# Patient Record
Sex: Male | Born: 1965 | Hispanic: Yes | Marital: Married | Smoking: Light tobacco smoker
Health system: Southern US, Community
[De-identification: ages and names within clinical notes are randomized; demographics above are authoritative.]

---

## 2019-02-15 ENCOUNTER — Emergency Department (HOSPITAL_COMMUNITY): Payer: Self-pay

## 2019-02-15 ENCOUNTER — Other Ambulatory Visit: Payer: Self-pay

## 2019-02-15 ENCOUNTER — Emergency Department (HOSPITAL_COMMUNITY): Payer: Self-pay | Admitting: Anesthesiology

## 2019-02-15 ENCOUNTER — Encounter (HOSPITAL_COMMUNITY)
Admission: EM | Disposition: A | Discharge status: Home / Self Care | Payer: Self-pay | Source: Home / Self Care | Attending: Emergency Medicine

## 2019-02-15 ENCOUNTER — Encounter (HOSPITAL_COMMUNITY): Payer: Self-pay | Admitting: Emergency Medicine

## 2019-02-15 ENCOUNTER — Ambulatory Visit (HOSPITAL_COMMUNITY)
Admission: EM | Admit: 2019-02-15 | Discharge: 2019-02-15 | Disposition: A | Discharge status: Home / Self Care | Payer: Self-pay | Attending: Emergency Medicine | Admitting: Emergency Medicine

## 2019-02-15 DIAGNOSIS — S6991XA Unspecified injury of right wrist, hand and finger(s), initial encounter: Secondary | ICD-10-CM

## 2019-02-15 DIAGNOSIS — Y990 Civilian activity done for income or pay: Secondary | ICD-10-CM | POA: Insufficient documentation

## 2019-02-15 DIAGNOSIS — W268XXA Contact with other sharp object(s), not elsewhere classified, initial encounter: Secondary | ICD-10-CM | POA: Insufficient documentation

## 2019-02-15 DIAGNOSIS — F17200 Nicotine dependence, unspecified, uncomplicated: Secondary | ICD-10-CM | POA: Insufficient documentation

## 2019-02-15 DIAGNOSIS — S68521A Partial traumatic transphalangeal amputation of right thumb, initial encounter: Secondary | ICD-10-CM | POA: Insufficient documentation

## 2019-02-15 DIAGNOSIS — Z20828 Contact with and (suspected) exposure to other viral communicable diseases: Secondary | ICD-10-CM | POA: Insufficient documentation

## 2019-02-15 DIAGNOSIS — Z419 Encounter for procedure for purposes other than remedying health state, unspecified: Secondary | ICD-10-CM

## 2019-02-15 HISTORY — PX: I & D EXTREMITY: SHX5045

## 2019-02-15 LAB — SARS CORONAVIRUS 2 BY RT PCR (HOSPITAL ORDER, PERFORMED IN ~~LOC~~ HOSPITAL LAB): SARS Coronavirus 2: NEGATIVE

## 2019-02-15 SURGERY — IRRIGATION AND DEBRIDEMENT EXTREMITY
Anesthesia: General | Site: Finger | Laterality: Right

## 2019-02-15 MED ORDER — PROMETHAZINE HCL 25 MG/ML IJ SOLN: 6.2500 mg | INTRAMUSCULAR | Status: DC | PRN

## 2019-02-15 MED ORDER — LACTATED RINGERS IV SOLN: INTRAVENOUS | Status: DC

## 2019-02-15 MED ORDER — TETANUS-DIPHTH-ACELL PERTUSSIS 5-2.5-18.5 LF-MCG/0.5 IM SUSP: 0.5000 mL | Freq: Once | INTRAMUSCULAR | Status: DC

## 2019-02-15 MED ORDER — FENTANYL CITRATE (PF) 100 MCG/2ML IJ SOLN
INTRAMUSCULAR | Status: DC | PRN
  Administered 2019-02-15 (×2): 100 ug via INTRAVENOUS

## 2019-02-15 MED ORDER — EPHEDRINE SULFATE 50 MG/ML IJ SOLN
INTRAMUSCULAR | Status: DC | PRN
  Administered 2019-02-15: 10 mg via INTRAVENOUS

## 2019-02-15 MED ORDER — MIDAZOLAM HCL 5 MG/5ML IJ SOLN
INTRAMUSCULAR | Status: DC | PRN
  Administered 2019-02-15: 2 mg via INTRAVENOUS

## 2019-02-15 MED ORDER — 0.9 % SODIUM CHLORIDE (POUR BTL) OPTIME
TOPICAL | Status: DC | PRN
  Administered 2019-02-15: 19:00:00 1000 mL

## 2019-02-15 MED ORDER — HYDROMORPHONE HCL 1 MG/ML IJ SOLN: 0.2500 mg | INTRAMUSCULAR | Status: DC | PRN

## 2019-02-15 MED ORDER — MIDAZOLAM HCL 2 MG/2ML IJ SOLN
INTRAMUSCULAR | Status: AC
  Filled 2019-02-15: qty 2

## 2019-02-15 MED ORDER — LIDOCAINE 2% (20 MG/ML) 5 ML SYRINGE
INTRAMUSCULAR | Status: DC | PRN
  Administered 2019-02-15: 40 mg via INTRAVENOUS

## 2019-02-15 MED ORDER — ONDANSETRON HCL 4 MG/2ML IJ SOLN
INTRAMUSCULAR | Status: DC | PRN
  Administered 2019-02-15: 4 mg via INTRAVENOUS

## 2019-02-15 MED ORDER — OXYCODONE HCL 5 MG PO TABS: 5.0000 mg | ORAL_TABLET | Freq: Four times a day (QID) | ORAL | 0 refills | Status: AC | PRN

## 2019-02-15 MED ORDER — ACETAMINOPHEN 10 MG/ML IV SOLN: 1000.0000 mg | Freq: Once | INTRAVENOUS | Status: DC | PRN

## 2019-02-15 MED ORDER — BUPIVACAINE-EPINEPHRINE (PF) 0.5% -1:200000 IJ SOLN
INTRAMUSCULAR | Status: DC | PRN
  Administered 2019-02-15: 7 mL

## 2019-02-15 MED ORDER — LIDOCAINE HCL (PF) 1 % IJ SOLN
INTRAMUSCULAR | Status: AC
  Filled 2019-02-15: qty 30

## 2019-02-15 MED ORDER — ACETAMINOPHEN 325 MG PO TABS: 650.0000 mg | ORAL_TABLET | Freq: Four times a day (QID) | ORAL | Status: AC

## 2019-02-15 MED ORDER — BUPIVACAINE-EPINEPHRINE (PF) 0.5% -1:200000 IJ SOLN
INTRAMUSCULAR | Status: AC
  Filled 2019-02-15: qty 30

## 2019-02-15 MED ORDER — CEPHALEXIN 500 MG PO CAPS: 500.0000 mg | ORAL_CAPSULE | Freq: Four times a day (QID) | ORAL | 0 refills | Status: AC

## 2019-02-15 MED ORDER — LACTATED RINGERS IV SOLN
INTRAVENOUS | Status: DC
  Administered 2019-02-15: 18:00:00 via INTRAVENOUS

## 2019-02-15 MED ORDER — POVIDONE-IODINE 10 % EX SWAB: 2.0000 "application " | Freq: Once | CUTANEOUS | Status: DC

## 2019-02-15 MED ORDER — CHLORHEXIDINE GLUCONATE 4 % EX LIQD
60.0000 mL | Freq: Once | CUTANEOUS | Status: DC
  Filled 2019-02-15: qty 60

## 2019-02-15 MED ORDER — HYDROCODONE-ACETAMINOPHEN 5-325 MG PO TABS
1.0000 | ORAL_TABLET | Freq: Once | ORAL | Status: AC
  Administered 2019-02-15: 17:00:00 1 via ORAL
  Filled 2019-02-15: qty 1

## 2019-02-15 MED ORDER — ACETAMINOPHEN 160 MG/5ML PO SOLN: 325.0000 mg | Freq: Once | ORAL | Status: DC | PRN

## 2019-02-15 MED ORDER — LACTATED RINGERS IV SOLN
INTRAVENOUS | Status: DC | PRN
  Administered 2019-02-15: 18:00:00 via INTRAVENOUS

## 2019-02-15 MED ORDER — FENTANYL CITRATE (PF) 250 MCG/5ML IJ SOLN
INTRAMUSCULAR | Status: AC
  Filled 2019-02-15: qty 5

## 2019-02-15 MED ORDER — CEFAZOLIN SODIUM-DEXTROSE 2-4 GM/100ML-% IV SOLN
2.0000 g | INTRAVENOUS | Status: AC
  Administered 2019-02-15: 2 g via INTRAVENOUS
  Filled 2019-02-15: qty 100

## 2019-02-15 MED ORDER — SUCCINYLCHOLINE CHLORIDE 20 MG/ML IJ SOLN
INTRAMUSCULAR | Status: DC | PRN
  Administered 2019-02-15: 120 mg via INTRAVENOUS

## 2019-02-15 MED ORDER — BUPIVACAINE HCL 0.5 % IJ SOLN
INTRAMUSCULAR | Status: AC
  Filled 2019-02-15: qty 1

## 2019-02-15 MED ORDER — GLYCOPYRROLATE PF 0.2 MG/ML IJ SOSY
PREFILLED_SYRINGE | INTRAMUSCULAR | Status: DC | PRN
  Administered 2019-02-15: .2 mg via INTRAVENOUS

## 2019-02-15 MED ORDER — PHENYLEPHRINE 40 MCG/ML (10ML) SYRINGE FOR IV PUSH (FOR BLOOD PRESSURE SUPPORT)
PREFILLED_SYRINGE | INTRAVENOUS | Status: AC
  Filled 2019-02-15: qty 10

## 2019-02-15 MED ORDER — ACETAMINOPHEN 325 MG PO TABS: 325.0000 mg | ORAL_TABLET | Freq: Once | ORAL | Status: DC | PRN

## 2019-02-15 MED ORDER — MEPERIDINE HCL 25 MG/ML IJ SOLN: 6.2500 mg | INTRAMUSCULAR | Status: DC | PRN

## 2019-02-15 MED ORDER — ONDANSETRON HCL 4 MG/2ML IJ SOLN
INTRAMUSCULAR | Status: AC
  Filled 2019-02-15: qty 2

## 2019-02-15 MED ORDER — PROPOFOL 10 MG/ML IV BOLUS
INTRAVENOUS | Status: DC | PRN
  Administered 2019-02-15: 140 mg via INTRAVENOUS

## 2019-02-15 SURGICAL SUPPLY — 43 items
BLADE SURG 15 STRL LF DISP TIS (BLADE) ×1 IMPLANT
BLADE SURG 15 STRL SS (BLADE) ×2
BNDG COHESIVE 1X5 TAN STRL LF (GAUZE/BANDAGES/DRESSINGS) ×3 IMPLANT
BNDG COHESIVE 2X5 TAN STRL LF (GAUZE/BANDAGES/DRESSINGS) IMPLANT
BNDG COHESIVE 4X5 TAN STRL (GAUZE/BANDAGES/DRESSINGS) ×3 IMPLANT
BNDG CONFORM 2 STRL LF (GAUZE/BANDAGES/DRESSINGS) ×3 IMPLANT
BNDG GAUZE ELAST 4 BULKY (GAUZE/BANDAGES/DRESSINGS) ×6 IMPLANT
CHLORAPREP W/TINT 26 (MISCELLANEOUS) ×3 IMPLANT
CORD BIPOLAR FORCEPS 12FT (ELECTRODE) ×3 IMPLANT
COVER WAND RF STERILE (DRAPES) ×3 IMPLANT
CUFF TOURN SGL QUICK 18X4 (TOURNIQUET CUFF) IMPLANT
DRAPE C-ARM 42X72 X-RAY (DRAPES) ×3 IMPLANT
DRAPE SURG 17X23 STRL (DRAPES) ×3 IMPLANT
DRSG EMULSION OIL 3X3 NADH (GAUZE/BANDAGES/DRESSINGS) ×3 IMPLANT
GAUZE SPONGE 2X2 8PLY STRL LF (GAUZE/BANDAGES/DRESSINGS) ×1 IMPLANT
GAUZE SPONGE 4X4 12PLY STRL (GAUZE/BANDAGES/DRESSINGS) ×3 IMPLANT
GAUZE XEROFORM 1X8 LF (GAUZE/BANDAGES/DRESSINGS) ×3 IMPLANT
GLOVE BIO SURGEON STRL SZ7.5 (GLOVE) ×3 IMPLANT
GLOVE BIOGEL PI IND STRL 7.0 (GLOVE) ×1 IMPLANT
GLOVE BIOGEL PI IND STRL 8 (GLOVE) ×1 IMPLANT
GLOVE BIOGEL PI INDICATOR 7.0 (GLOVE) ×2
GLOVE BIOGEL PI INDICATOR 8 (GLOVE) ×2
GLOVE ECLIPSE 6.5 STRL STRAW (GLOVE) ×3 IMPLANT
GOWN STRL REUS W/ TWL LRG LVL3 (GOWN DISPOSABLE) ×2 IMPLANT
GOWN STRL REUS W/ TWL XL LVL3 (GOWN DISPOSABLE) ×1 IMPLANT
GOWN STRL REUS W/TWL LRG LVL3 (GOWN DISPOSABLE) ×4
GOWN STRL REUS W/TWL XL LVL3 (GOWN DISPOSABLE) ×2
K-WIRE DBL TROCAR .045X4 ×6 IMPLANT
KIT BASIN OR (CUSTOM PROCEDURE TRAY) ×3 IMPLANT
KWIRE DBL TROCAR .045X4 ×2 IMPLANT
NEEDLE HYPO 25X1 1.5 SAFETY (NEEDLE) IMPLANT
NS IRRIG 1000ML POUR BTL (IV SOLUTION) ×3 IMPLANT
PACK ORTHO EXTREMITY (CUSTOM PROCEDURE TRAY) ×3 IMPLANT
PADDING CAST ABS 4INX4YD NS (CAST SUPPLIES)
PADDING CAST ABS COTTON 4X4 ST (CAST SUPPLIES) IMPLANT
RUBBERBAND STERILE (MISCELLANEOUS) IMPLANT
SET IRRIG Y TYPE TUR BLADDER L (SET/KITS/TRAYS/PACK) IMPLANT
SPONGE GAUZE 2X2 STER 10/PKG (GAUZE/BANDAGES/DRESSINGS) ×2
STOCKINETTE 6  STRL (DRAPES) ×2
STOCKINETTE 6 STRL (DRAPES) ×1 IMPLANT
SUT VICRYL RAPIDE 4/0 PS 2 (SUTURE) IMPLANT
SYR 10ML LL (SYRINGE) IMPLANT
UNDERPAD 30X30 (UNDERPADS AND DIAPERS) ×3 IMPLANT

## 2019-02-15 NOTE — ED Provider Notes (Signed)
[facility] EMERGENCY DEPARTMENT Provider Note   CSN: [id number] Arrival date & time: 02/15/19  1530    History   Chief Complaint Chief Complaint  Patient presents with  . Finger Injury    HPI Gerald Poole is a 53 y.o. male presenting for evaluation of right thumb injury.  Patient states prior to arrival he was at work when he accidentally cut his right thumb.  He reports falling at the site.  Last week he injured his right pinky, was seen at urgent care.  At that time, his tetanus was updated.  Patient states this is improved today, he has not taken anything for pain including Tylenol ibuprofen.  He states he has no medical problems, takes no medications daily.  He is not on blood thinners.  He denies numbness.  He denies injury elsewhere.     HPI  History reviewed. No pertinent past medical history.  There are no active problems to display for this patient.   History reviewed. No pertinent surgical history.      Home Medications    Prior to Admission medications   Not on File    Family History History reviewed. No pertinent family history.  Social History Social History   Tobacco Use  . Smoking status: Light Tobacco Smoker  . Smokeless tobacco: Never Used  Substance Use Topics  . Alcohol use: Never    Frequency: Never  . Drug use: Never     Allergies   Patient has no known allergies.   Review of Systems Review of Systems  Skin: Positive for wound.  Neurological: Negative for numbness.  Hematological: Does not bruise/bleed easily.  All other systems reviewed and are negative.    Physical Exam Updated Vital Signs BP (!) 159/99 (BP Location: Left Arm)   Pulse (!) 55   Temp 98.5 F (36.9 C) (Oral)   Resp 18   Ht 5\' 8"  (1.727 m)   Wt 75.3 kg   SpO2 99%   BMI 25.24 kg/m   Physical Exam Vitals signs and nursing note reviewed.  Constitutional:      General: He is not in acute distress.    Appearance: He is  well-developed.     Comments: Appears nontoxic  HENT:     Head: Normocephalic and atraumatic.  Neck:     Musculoskeletal: Normal range of motion.  Cardiovascular:     Rate and Rhythm: Normal rate and regular rhythm.     Pulses: Normal pulses.  Pulmonary:     Effort: Pulmonary effort is normal.     Breath sounds: Normal breath sounds.  Abdominal:     General: There is no distension.     Palpations: There is no mass.     Tenderness: There is no abdominal tenderness. There is no guarding or rebound.  Musculoskeletal: Normal range of motion.     Comments: Large, almost complete, amputation of distal R thumb (just distal to the IP). Skin intact on ulnar aspect of thumb.  Full active ROM of the thumb. Distal sensation intact. Good distal cap refill.  Skin:    General: Skin is warm.     Capillary Refill: Capillary refill takes less than 2 seconds.     Findings: No rash.  Neurological:     Mental Status: He is alert and oriented to person, place, and time.      ED Treatments / Results  Labs (all labs ordered are listed, but only abnormal results are displayed) Labs Reviewed  SARS CORONAVIRUS 2 (HOSPITAL ORDER[facility])    EKG None  Radiology Dg Finger Thumb Right  Result Date: 02/15/2019 CLINICAL DATA:  Amputation while cutting wood. EXAM: RIGHT THUMB 2+V COMPARISON:  None. FINDINGS: There is been soft tissue and osseous amputation at the level of the mid shaft of the first distal phalanx. There is mild dorsal angulation of the distal fracture fragment. No underlying radiopaque foreign bodies. IMPRESSION: 1. Status post soft tissue osseous and soft tissue amputation of the left thumb at the level of the mid shaft of distal phalanx. Electronically Signed   By: Signa Kellaylor  Stroud M.D.   On: 02/15/2019 16:47    Procedures Procedures (including critical care time)  Medications Ordered in ED Medications  HYDROcodone-acetaminophen (NORCO/VICODIN) 5-325 MG  per tablet 1 tablet (has no administration in time range)     Initial Impression / Assessment and Plan / ED Course  I have reviewed the triage vital signs and the nursing notes.  Pertinent labs & imaging results that were available during my care of the patient were reviewed by me and considered in my medical decision making (see chart for details).        Patient coming for evaluation of thumb injury.  Physical exam shows patient is neurovascularly intact.  However, he does have a very large injury to the right thumb, almost complete ampuation.  Discussed with Prince RomeM Jeffery, PA-C with orthopedics who recommends pt go to OR with Dr. Janee Mornthompson. Recommends xray and covid prior to OR. Tetanus updated last week.   X-ray shows amputation of the distal thumb.  Patient to go to OR for I&D.   Final Clinical Impressions(s) / ED Diagnoses   Final diagnoses:  Injury of right thumb, initial encounter    ED Discharge Orders    None       Alveria ApleyCaccavale, Yeiden Frenkel, PA-C 02/15/19 1655    Virgina NorfolkCuratolo, Adam, DO 02/15/19 1700

## 2019-02-15 NOTE — Anesthesia Postprocedure Evaluation (Signed)
Anesthesia Post Note  Patient: Jaiveer Panas  Procedure(s) Performed: Revision of right thumb amputation and repair as needed (Right Finger)     Patient location during evaluation: PACU Anesthesia Type: General Level of consciousness: awake and alert Pain management: pain level controlled Vital Signs Assessment: post-procedure vital signs reviewed and stable Respiratory status: spontaneous breathing, nonlabored ventilation, respiratory function stable and patient connected to nasal cannula oxygen Cardiovascular status: blood pressure returned to baseline and stable Postop Assessment: no apparent nausea or vomiting Anesthetic complications: no    Last Vitals:  Vitals:   02/15/19 2015 02/15/19 2030  BP: (!) 142/98 (!) 146/100  Pulse: 78 68  Resp: 18 14  Temp:  (!) 36.4 C  SpO2: 100% 100%    Last Pain:  Vitals:   02/15/19 2030  TempSrc:   PainSc: 0-No pain                 Effie Berkshire

## 2019-02-15 NOTE — Anesthesia Procedure Notes (Signed)
Procedure Name: Intubation Date/Time: 02/15/2019 7:10 PM Performed by: Eligha Bridegroom, CRNA Pre-anesthesia Checklist: Patient identified, Emergency Drugs available, Suction available, Patient being monitored and Timeout performed Patient Re-evaluated:Patient Re-evaluated prior to induction Oxygen Delivery Method: Circle system utilized Preoxygenation: Pre-oxygenation with 100% oxygen Induction Type: IV induction, Rapid sequence and Cricoid Pressure applied Laryngoscope Size: Mac and 4 Grade View: Grade I Tube type: Oral Tube size: 7.5 mm Number of attempts: 1 Airway Equipment and Method: Stylet Placement Confirmation: ETT inserted through vocal cords under direct vision,  positive ETCO2 and breath sounds checked- equal and bilateral Secured at: 22 cm Tube secured with: Tape Dental Injury: Teeth and Oropharynx as per pre-operative assessment

## 2019-02-15 NOTE — Discharge Instructions (Signed)
Discharge Instructions   You have a light dressing on your hand with a splint.  You may begin gentle motion of your fingers and hand immediately, but you should not do any heavy lifting or gripping.  Elevate your hand to reduce pain & swelling of the digits.  Ice over the operative site may be helpful to reduce pain & swelling.  DO NOT USE HEAT. Pain medicine has been prescribed for you.  Take all of the antibiotic as prescribed. Take Tylenol 650 mg OTC and Ibuprofen 800 mg as prescribed every 6 hours together. Take the Oxycodone as a rescue medicine for severe post operative pain.  Leave the dressing in place until you return for your first post operative appointment. Keep it clean and dry. You may drive a car when you are off of prescription pain medications and can safely control your vehicle with both hands. We will address whether therapy will be required or not when you return to the office. You may have already made your follow-up appointment when we completed your preop visit.  If not, please call our office today or the next business day to make your return appointment for 10-15 days after surgery.   Please call [phone] during normal business hours or [phone] after hours for any problems. Including the following:  - excessive redness of the incisions - drainage for more than 4 days - fever of more than 101.5 F  *Please note that pain medications will not be refilled after hours or on weekends.  WORK STATUS: OUT OF WORK until 02/25/19. Then may return to work with NO work with the right hand.

## 2019-02-15 NOTE — Anesthesia Preprocedure Evaluation (Signed)
Anesthesia Evaluation  Patient identified by MRN, date of birth, ID band Patient awake    Reviewed: Allergy & Precautions, NPO status , Patient's Chart, lab work & pertinent test results  Airway Mallampati: I  TM Distance: >3 FB Neck ROM: Full    Dental  (+) Teeth Intact, Dental Advisory Given   Pulmonary Current Smoker,    breath sounds clear to auscultation       Cardiovascular negative cardio ROS   Rhythm:Regular Rate:Normal     Neuro/Psych negative neurological ROS  negative psych ROS   GI/Hepatic negative GI ROS, Neg liver ROS,   Endo/Other  negative endocrine ROS  Renal/GU negative Renal ROS     Musculoskeletal   Abdominal Normal abdominal exam  (+)   Peds  Hematology negative hematology ROS (+)   Anesthesia Other Findings   Reproductive/Obstetrics                             Anesthesia Physical Anesthesia Plan  ASA: II  Anesthesia Plan: General   Post-op Pain Management:    Induction: Intravenous  PONV Risk Score and Plan: 2 and Ondansetron, Dexamethasone and Midazolam  Airway Management Planned: Oral ETT  Additional Equipment: None  Intra-op Plan:   Post-operative Plan: Extubation in OR  Informed Consent: I have reviewed the patients History and Physical, chart, labs and discussed the procedure including the risks, benefits and alternatives for the proposed anesthesia with the patient or authorized representative who has indicated his/her understanding and acceptance.     Dental advisory given  Plan Discussed with: CRNA  Anesthesia Plan Comments:         Anesthesia Quick Evaluation

## 2019-02-15 NOTE — ED Triage Notes (Signed)
Pt was at work cutting wood using a machine. While cutting wood machine cut pt right thumb. Pt was just seen for similar injury to little finger on same hand last Tuesday.

## 2019-02-15 NOTE — Consult Note (Addendum)
Reason for Consult:Right thumb amputation Referring Physician: A Curatolo  Gerald Poole is an 53 y.o. male.  HPI: Gerald Poole was at work cutting wood with a saw and got his thumb caught in the blade. He was hampered by a splint involving his little finger which got cut and broken on a fan blade about a week ago. He went to [facility] and then was directed to the ED for treatment. He is RHD.  History reviewed. No pertinent past medical history.  History reviewed. No pertinent surgical history.  History reviewed. No pertinent family history.  Social History:  reports that he has been smoking. He has never used smokeless tobacco. He reports that he does not drink alcohol or use drugs.  Allergies: No Known Allergies  Medications: I have reviewed the patient's current medications.  No results found for this or any previous visit (from the past 48 hour(s)).  No results found.  Review of Systems  Constitutional: Negative for weight loss.  HENT: Negative for ear discharge, ear pain, hearing loss and tinnitus.   Eyes: Negative for blurred vision, double vision, photophobia and pain.  Respiratory: Negative for cough, sputum production and shortness of breath.   Cardiovascular: Negative for chest pain.  Gastrointestinal: Negative for abdominal pain, nausea and vomiting.  Genitourinary: Negative for dysuria, flank pain, frequency and urgency.  Musculoskeletal: Positive for joint pain (Right thumb). Negative for back pain, falls, myalgias and neck pain.  Neurological: Negative for dizziness, tingling, sensory change, focal weakness, loss of consciousness and headaches.  Endo/Heme/Allergies: Does not bruise/bleed easily.  Psychiatric/Behavioral: Negative for depression, memory loss and substance abuse. The patient is not nervous/anxious.    Blood pressure (!) 159/99, pulse (!) 55, temperature 98.5 F (36.9 C), temperature source Oral, resp. rate 18, height 5\' 8"  (1.727 m), weight 75.3 kg,  SpO2 99 %. Physical Exam  Constitutional: He appears well-developed and well-nourished. No distress.  HENT:  Head: Normocephalic and atraumatic.  Eyes: Conjunctivae are normal. Right eye exhibits no discharge. Left eye exhibits no discharge. No scleral icterus.  Neck: Normal range of motion.  Cardiovascular: Normal rate and regular rhythm.  Respiratory: Effort normal. No respiratory distress.  Musculoskeletal:     Comments: Right shoulder, elbow, wrist, digits- Partial amputation thumb proximal to nail, flap with cap refill ~3-4s, nontender (digital block placed at UC), unable to extend tip, little finger in AL splint, no instability, no blocks to motion  Sens  Ax/R/M/U intact  Mot   Ax/ R/ PIN/ M/ AIN/ U intact  Rad 2+  Neurological: He is alert.  Skin: Skin is warm and dry. He is not diaphoretic.  Psychiatric: He has a normal mood and affect. His behavior is normal.    Assessment/Plan: Right thumb amputation -- To OR for revision vs repair/reconstruction by Dr. Grandville Silos. NPO. Anticipate discharge after surgery.    Lisette Abu, PA-C Orthopedic Surgery [phone] 02/15/2019, 4:23 PM   Incomplete R thumb amputation s/p saw injury.  Flap appears viable at present.  Injury is oblique, passes thru germinal matrix.  Will remove nail plate, reduce and pin fx, repair skin and nail bed.  G/R/O reviewed and consent obtained. Will d/c with oral antibiotics.  Micheline Rough, MD Hand Surgery

## 2019-02-15 NOTE — Transfer of Care (Signed)
Immediate Anesthesia Transfer of Care Note  Patient: Gerald Poole  Procedure(s) Performed: Revision of right thumb amputation and repair as needed (Right Finger)  Patient Location: PACU  Anesthesia Type:General  Level of Consciousness: awake, alert  and oriented  Airway & Oxygen Therapy: Patient Spontanous Breathing  Post-op Assessment: Report given to RN  Post vital signs: Reviewed and stable  Last Vitals:  Vitals Value Taken Time  BP 150/86 02/15/19 2006  Temp    Pulse 72 02/15/19 2010  Resp 11 02/15/19 2010  SpO2 100 % 02/15/19 2010  Vitals shown include unvalidated device data.  Last Pain:  Vitals:   02/15/19 2006  TempSrc:   PainSc: (P) 0-No pain         Complications: No apparent anesthesia complications

## 2019-02-15 NOTE — Op Note (Signed)
02/15/2019  6:10 PM  PATIENT:  Gerald Poole  53 y.o. 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 Excisional debridement right thumb open fx, skin & SQ    2. Open Tx of right thumb distal phalanx fracture    3. Avulsion of right thumb nail plate    4. Simple repair right thumb laceration, 3cm    5. Right thumb nailbed repair  SURGEON: Cliffton Astersavid A. Janee Mornhompson, MD  PHYSICIAN ASSISTANT: Danielle RankinKirsten Schrader, OPA-C  ANESTHESIA:  general  SPECIMENS:  None  DRAINS:   None  EBL:  less than 50 mL  PREOPERATIVE INDICATIONS:  Gerald Poole is a  53 y.o. male with incomplete amputation through the right thumb, leaving a small ulnar-sided skin/SQ bridge intact  The risks benefits and alternatives were discussed with the patient preoperatively including but not limited to the risks of infection, bleeding, nerve injury, cardiopulmonary complications, the need for revision surgery, among others, and the patient verbalized understanding and consented to proceed.  OPERATIVE IMPLANTS: 0.045 in kwire x 2  OPERATIVE PROCEDURE:  After receiving prophylactic antibiotics, the patient was escorted to the operative theatre and placed in a supine position.  General anesthesia was administered.  A surgical "time-out" was performed during which the planned procedure, proposed operative site, and the correct patient identity were compared to the operative consent and agreement confirmed by the circulating nurse according to current facility policy.  Following application of a tourniquet to the operative extremity, the exposed skin was pre-scrubbed with a Hibiclens scrub brush before being formally prepped with Betadine and draped in the usual sterile fashion.  The limb was exsanguinated with an Esmarch bandage and the tourniquet inflated to approximately 100mmHg higher than systolic BP.  Some of the  jagged devitalized skin edges and accompanying subcutaneous tissue were excisionally debrided with forceps and scissors.  The damage was inspected.  Nail plate was removed using a freer elevator from both the distal portion as well as what remained of the proximal portion.  The wound was copiously irrigated, and then reduced and secured with 2 different 0.045 inch roughly parallel K wires.  The first 1 was driven retrograde and then back across the fracture, and also across the IP joint.  The second was driven simply from the tip the digit across the joint and well into the proximal phalanx.  As it turns out, a large portion of the germinal matrix was missing, with just a small bit measuring about 1 to 2 mm remaining, more on the radial than the ulnar side.  The traumatic laceration of skin measuring about 3 cm was reapproximated with 4-0 Vicryl Rapide interrupted sutures.  The nailbed was reapproximated with 7-0 chromic suture.  The K wires were bent over to lie alongside the dorsal surface of the nailbed and clipped.  Final images were obtained.  Half percent plain Marcaine was instilled at the base of the digit to help with postoperative pain control.  A splint dressing was applied with a dorsal tongue blade component.  In addition, the ulnar-sided wound of the hand was incorporated in the dressing.  DISPOSITION: He will be discharged home today, with a prescription for oral antibiotics and an analgesic regimen.  We will plan to have him return to work on 02-25-19, no work with the right hand, and plan to reevaluate him in the office in 10 to 15 days from surgery with new x-rays  of the right thumb out of splint, likely with a follow-on hand therapy appointment for splint fabrication and initiation of rehab.

## 2019-02-18 ENCOUNTER — Encounter (HOSPITAL_COMMUNITY): Payer: Self-pay | Admitting: Orthopedic Surgery

## 2019-10-06 DIAGNOSIS — Z20828 Contact with and (suspected) exposure to other viral communicable diseases: Secondary | ICD-10-CM | POA: Diagnosis not present

## 2020-02-03 DIAGNOSIS — Z1331 Encounter for screening for depression: Secondary | ICD-10-CM | POA: Diagnosis not present

## 2020-02-03 DIAGNOSIS — E785 Hyperlipidemia, unspecified: Secondary | ICD-10-CM | POA: Diagnosis not present

## 2020-02-03 DIAGNOSIS — F419 Anxiety disorder, unspecified: Secondary | ICD-10-CM | POA: Diagnosis not present

## 2020-02-03 DIAGNOSIS — R5382 Chronic fatigue, unspecified: Secondary | ICD-10-CM | POA: Diagnosis not present

## 2020-02-03 DIAGNOSIS — I10 Essential (primary) hypertension: Secondary | ICD-10-CM | POA: Diagnosis not present

## 2020-02-05 DIAGNOSIS — E785 Hyperlipidemia, unspecified: Secondary | ICD-10-CM | POA: Diagnosis not present

## 2020-02-05 DIAGNOSIS — I10 Essential (primary) hypertension: Secondary | ICD-10-CM | POA: Diagnosis not present

## 2020-02-05 DIAGNOSIS — F419 Anxiety disorder, unspecified: Secondary | ICD-10-CM | POA: Diagnosis not present

## 2020-02-05 DIAGNOSIS — E663 Overweight: Secondary | ICD-10-CM | POA: Diagnosis not present

## 2020-02-17 DIAGNOSIS — E663 Overweight: Secondary | ICD-10-CM | POA: Diagnosis not present

## 2020-02-17 DIAGNOSIS — I10 Essential (primary) hypertension: Secondary | ICD-10-CM | POA: Diagnosis not present

## 2020-02-17 DIAGNOSIS — F419 Anxiety disorder, unspecified: Secondary | ICD-10-CM | POA: Diagnosis not present

## 2020-02-17 DIAGNOSIS — E785 Hyperlipidemia, unspecified: Secondary | ICD-10-CM | POA: Diagnosis not present

## 2020-03-02 DIAGNOSIS — F419 Anxiety disorder, unspecified: Secondary | ICD-10-CM | POA: Diagnosis not present

## 2020-03-02 DIAGNOSIS — E663 Overweight: Secondary | ICD-10-CM | POA: Diagnosis not present

## 2020-03-02 DIAGNOSIS — E785 Hyperlipidemia, unspecified: Secondary | ICD-10-CM | POA: Diagnosis not present

## 2020-03-02 DIAGNOSIS — I10 Essential (primary) hypertension: Secondary | ICD-10-CM | POA: Diagnosis not present

## 2020-03-30 DIAGNOSIS — E785 Hyperlipidemia, unspecified: Secondary | ICD-10-CM | POA: Diagnosis not present

## 2020-03-30 DIAGNOSIS — I10 Essential (primary) hypertension: Secondary | ICD-10-CM | POA: Diagnosis not present

## 2020-03-30 DIAGNOSIS — E663 Overweight: Secondary | ICD-10-CM | POA: Diagnosis not present

## 2020-03-30 DIAGNOSIS — F419 Anxiety disorder, unspecified: Secondary | ICD-10-CM | POA: Diagnosis not present

## 2020-05-02 DIAGNOSIS — I10 Essential (primary) hypertension: Secondary | ICD-10-CM | POA: Diagnosis not present

## 2020-05-02 DIAGNOSIS — E663 Overweight: Secondary | ICD-10-CM | POA: Diagnosis not present

## 2020-05-02 DIAGNOSIS — E785 Hyperlipidemia, unspecified: Secondary | ICD-10-CM | POA: Diagnosis not present

## 2020-05-02 DIAGNOSIS — F419 Anxiety disorder, unspecified: Secondary | ICD-10-CM | POA: Diagnosis not present

## 2020-06-08 DIAGNOSIS — E785 Hyperlipidemia, unspecified: Secondary | ICD-10-CM | POA: Diagnosis not present

## 2020-06-08 DIAGNOSIS — E663 Overweight: Secondary | ICD-10-CM | POA: Diagnosis not present

## 2020-06-08 DIAGNOSIS — I10 Essential (primary) hypertension: Secondary | ICD-10-CM | POA: Diagnosis not present

## 2020-06-08 DIAGNOSIS — F419 Anxiety disorder, unspecified: Secondary | ICD-10-CM | POA: Diagnosis not present

## 2020-07-04 DIAGNOSIS — E785 Hyperlipidemia, unspecified: Secondary | ICD-10-CM | POA: Diagnosis not present

## 2020-07-04 DIAGNOSIS — F419 Anxiety disorder, unspecified: Secondary | ICD-10-CM | POA: Diagnosis not present

## 2020-07-04 DIAGNOSIS — E663 Overweight: Secondary | ICD-10-CM | POA: Diagnosis not present

## 2020-07-04 DIAGNOSIS — I10 Essential (primary) hypertension: Secondary | ICD-10-CM | POA: Diagnosis not present

## 2020-08-08 DIAGNOSIS — I10 Essential (primary) hypertension: Secondary | ICD-10-CM | POA: Diagnosis not present

## 2020-08-08 DIAGNOSIS — F419 Anxiety disorder, unspecified: Secondary | ICD-10-CM | POA: Diagnosis not present

## 2020-08-08 DIAGNOSIS — E785 Hyperlipidemia, unspecified: Secondary | ICD-10-CM | POA: Diagnosis not present

## 2020-08-08 DIAGNOSIS — Z6825 Body mass index (BMI) 25.0-25.9, adult: Secondary | ICD-10-CM | POA: Diagnosis not present

## 2020-09-04 DIAGNOSIS — I10 Essential (primary) hypertension: Secondary | ICD-10-CM | POA: Diagnosis not present

## 2020-09-04 DIAGNOSIS — K219 Gastro-esophageal reflux disease without esophagitis: Secondary | ICD-10-CM | POA: Diagnosis not present

## 2020-09-04 DIAGNOSIS — F419 Anxiety disorder, unspecified: Secondary | ICD-10-CM | POA: Diagnosis not present

## 2020-09-04 DIAGNOSIS — E785 Hyperlipidemia, unspecified: Secondary | ICD-10-CM | POA: Diagnosis not present

## 2020-10-14 DIAGNOSIS — K219 Gastro-esophageal reflux disease without esophagitis: Secondary | ICD-10-CM | POA: Diagnosis not present

## 2020-10-14 DIAGNOSIS — E785 Hyperlipidemia, unspecified: Secondary | ICD-10-CM | POA: Diagnosis not present

## 2020-10-14 DIAGNOSIS — I10 Essential (primary) hypertension: Secondary | ICD-10-CM | POA: Diagnosis not present

## 2020-10-14 DIAGNOSIS — F419 Anxiety disorder, unspecified: Secondary | ICD-10-CM | POA: Diagnosis not present

## 2020-11-14 DIAGNOSIS — E785 Hyperlipidemia, unspecified: Secondary | ICD-10-CM | POA: Diagnosis not present

## 2020-11-14 DIAGNOSIS — R1011 Right upper quadrant pain: Secondary | ICD-10-CM | POA: Diagnosis not present

## 2020-11-14 DIAGNOSIS — K219 Gastro-esophageal reflux disease without esophagitis: Secondary | ICD-10-CM | POA: Diagnosis not present

## 2020-11-14 DIAGNOSIS — F419 Anxiety disorder, unspecified: Secondary | ICD-10-CM | POA: Diagnosis not present

## 2020-11-14 DIAGNOSIS — I10 Essential (primary) hypertension: Secondary | ICD-10-CM | POA: Diagnosis not present

## 2020-11-18 DIAGNOSIS — K828 Other specified diseases of gallbladder: Secondary | ICD-10-CM | POA: Diagnosis not present

## 2020-12-04 DIAGNOSIS — F419 Anxiety disorder, unspecified: Secondary | ICD-10-CM | POA: Diagnosis not present

## 2020-12-04 DIAGNOSIS — K219 Gastro-esophageal reflux disease without esophagitis: Secondary | ICD-10-CM | POA: Diagnosis not present

## 2020-12-04 DIAGNOSIS — I10 Essential (primary) hypertension: Secondary | ICD-10-CM | POA: Diagnosis not present

## 2020-12-04 DIAGNOSIS — K838 Other specified diseases of biliary tract: Secondary | ICD-10-CM | POA: Diagnosis not present

## 2020-12-15 DIAGNOSIS — K811 Chronic cholecystitis: Secondary | ICD-10-CM | POA: Diagnosis not present

## 2020-12-17 DIAGNOSIS — K838 Other specified diseases of biliary tract: Secondary | ICD-10-CM | POA: Diagnosis not present

## 2020-12-17 DIAGNOSIS — I10 Essential (primary) hypertension: Secondary | ICD-10-CM | POA: Diagnosis not present

## 2020-12-17 DIAGNOSIS — F419 Anxiety disorder, unspecified: Secondary | ICD-10-CM | POA: Diagnosis not present

## 2020-12-17 DIAGNOSIS — K219 Gastro-esophageal reflux disease without esophagitis: Secondary | ICD-10-CM | POA: Diagnosis not present

## 2020-12-28 DIAGNOSIS — F419 Anxiety disorder, unspecified: Secondary | ICD-10-CM | POA: Diagnosis not present

## 2020-12-28 DIAGNOSIS — K824 Cholesterolosis of gallbladder: Secondary | ICD-10-CM | POA: Diagnosis not present

## 2020-12-28 DIAGNOSIS — K219 Gastro-esophageal reflux disease without esophagitis: Secondary | ICD-10-CM | POA: Diagnosis not present

## 2020-12-28 DIAGNOSIS — Z48815 Encounter for surgical aftercare following surgery on the digestive system: Secondary | ICD-10-CM | POA: Diagnosis not present

## 2020-12-28 DIAGNOSIS — I10 Essential (primary) hypertension: Secondary | ICD-10-CM | POA: Diagnosis not present

## 2020-12-28 DIAGNOSIS — Z87891 Personal history of nicotine dependence: Secondary | ICD-10-CM | POA: Diagnosis not present

## 2020-12-28 DIAGNOSIS — K811 Chronic cholecystitis: Secondary | ICD-10-CM | POA: Diagnosis not present

## 2020-12-28 DIAGNOSIS — K8010 Calculus of gallbladder with chronic cholecystitis without obstruction: Secondary | ICD-10-CM | POA: Diagnosis not present

## 2021-01-08 DIAGNOSIS — Z09 Encounter for follow-up examination after completed treatment for conditions other than malignant neoplasm: Secondary | ICD-10-CM | POA: Diagnosis not present

## 2021-01-14 DIAGNOSIS — K219 Gastro-esophageal reflux disease without esophagitis: Secondary | ICD-10-CM | POA: Diagnosis not present

## 2021-01-14 DIAGNOSIS — K838 Other specified diseases of biliary tract: Secondary | ICD-10-CM | POA: Diagnosis not present

## 2021-01-14 DIAGNOSIS — I10 Essential (primary) hypertension: Secondary | ICD-10-CM | POA: Diagnosis not present

## 2021-01-14 DIAGNOSIS — F419 Anxiety disorder, unspecified: Secondary | ICD-10-CM | POA: Diagnosis not present

## 2021-01-29 IMAGING — RF RIGHT THUMB 2+V
1 series · 2 of 2 positions shown · non-contrast
Comparison: Pre reduction radiographs right thumb February 14, 2019

CLINICAL DATA: Fixation for distal phalanx fracture

EXAM:
RIGHT THUMB 2+V; DG C-ARM 61-120 MIN

[Series 1: run · 2 of 2 slices shown]
[im 1/2]
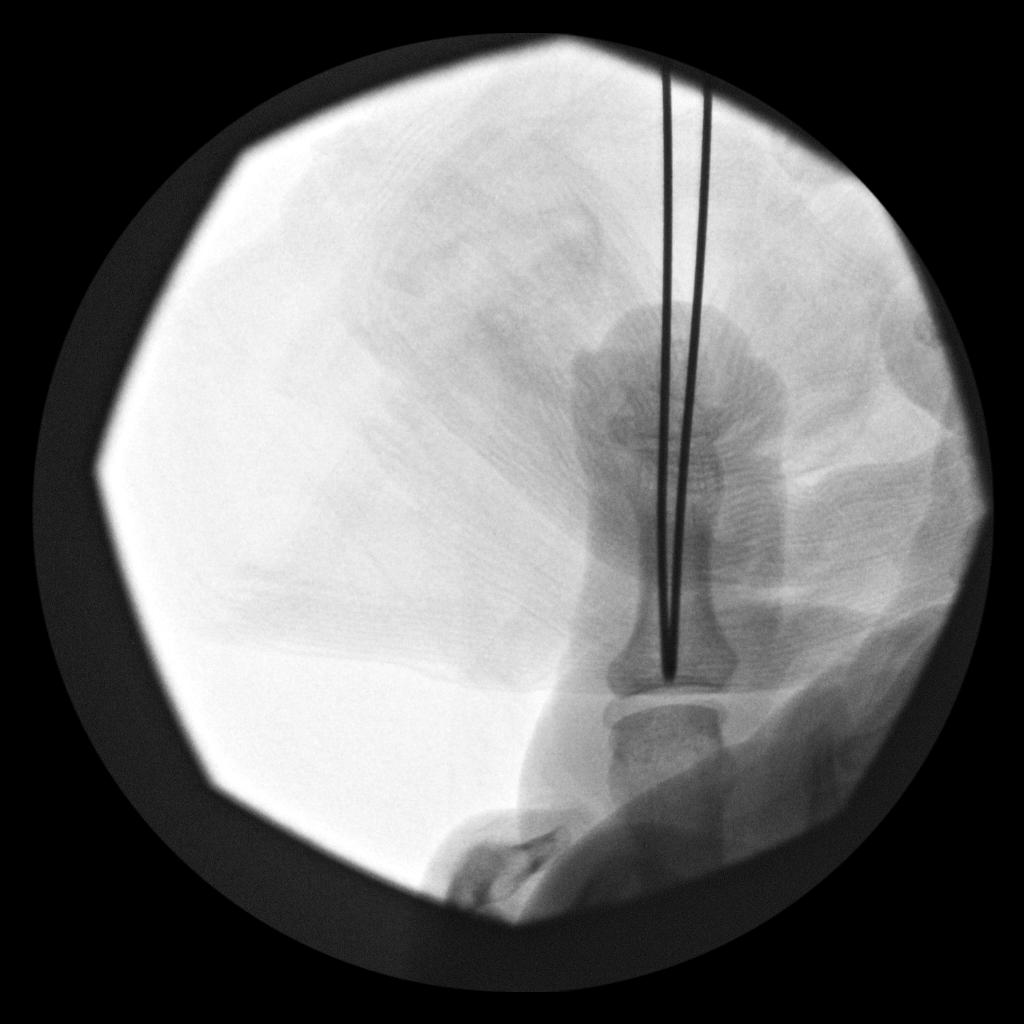
[im 2/2]
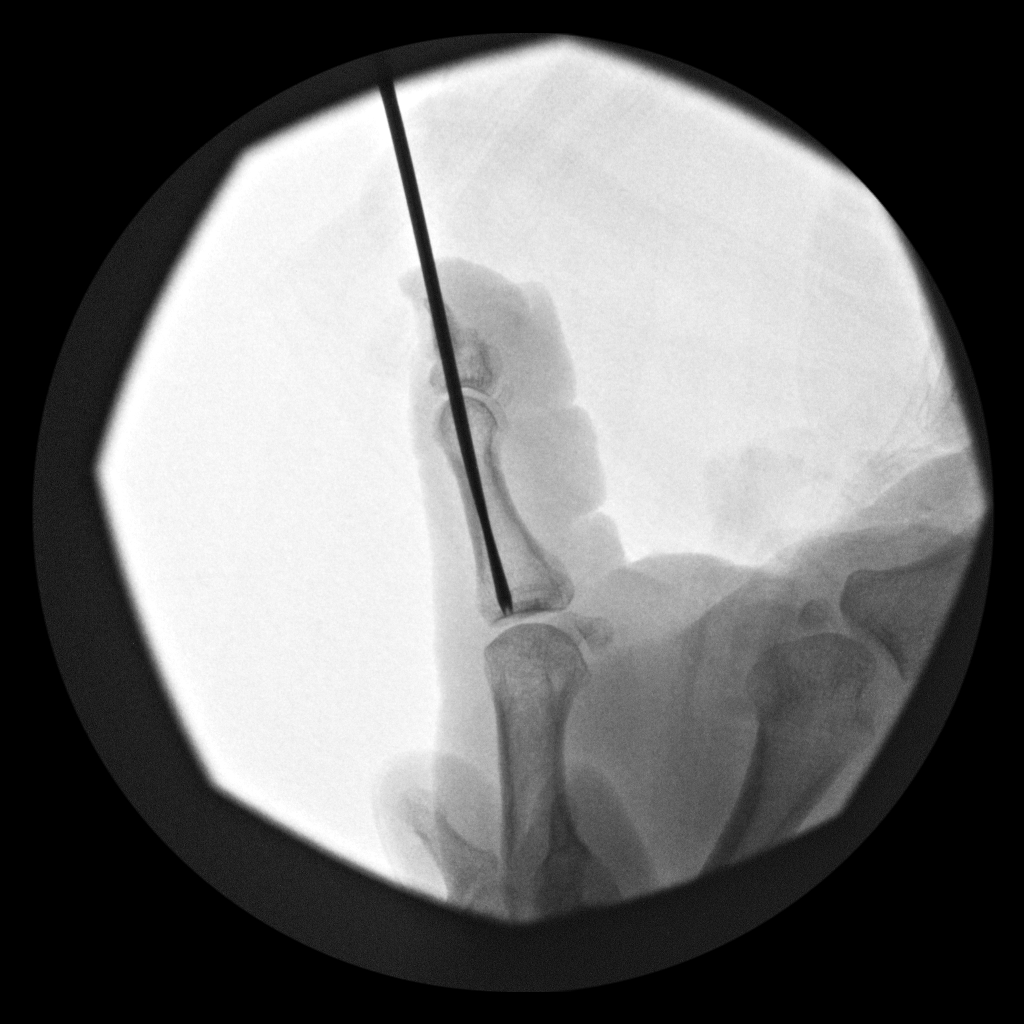

[2 of 2 positions shown; findings below may reference images not displayed]

FINDINGS: Frontal and lateral views obtained. There is pin fixation through a
comminuted fracture of the midportion of the first distal phalanx.
Alignment at the fracture site is essentially anatomic. No new
fracture. No dislocation. Joint spaces appear normal.
IMPRESSION: Pin fixation through a comminuted fracture of the midportion of the
first distal phalanx with alignment essentially anatomic. No other
fracture. No dislocation. No appreciable arthropathy.

## 2021-02-04 DIAGNOSIS — I10 Essential (primary) hypertension: Secondary | ICD-10-CM | POA: Diagnosis not present

## 2021-02-04 DIAGNOSIS — F419 Anxiety disorder, unspecified: Secondary | ICD-10-CM | POA: Diagnosis not present

## 2021-02-04 DIAGNOSIS — K219 Gastro-esophageal reflux disease without esophagitis: Secondary | ICD-10-CM | POA: Diagnosis not present

## 2021-02-04 DIAGNOSIS — E785 Hyperlipidemia, unspecified: Secondary | ICD-10-CM | POA: Diagnosis not present

## 2021-02-04 DIAGNOSIS — B029 Zoster without complications: Secondary | ICD-10-CM | POA: Diagnosis not present

## 2021-03-04 DIAGNOSIS — E785 Hyperlipidemia, unspecified: Secondary | ICD-10-CM | POA: Diagnosis not present

## 2021-03-04 DIAGNOSIS — I10 Essential (primary) hypertension: Secondary | ICD-10-CM | POA: Diagnosis not present

## 2021-03-04 DIAGNOSIS — K219 Gastro-esophageal reflux disease without esophagitis: Secondary | ICD-10-CM | POA: Diagnosis not present

## 2021-03-04 DIAGNOSIS — F419 Anxiety disorder, unspecified: Secondary | ICD-10-CM | POA: Diagnosis not present

## 2021-03-04 DIAGNOSIS — Z1331 Encounter for screening for depression: Secondary | ICD-10-CM | POA: Diagnosis not present

## 2021-04-01 DIAGNOSIS — F419 Anxiety disorder, unspecified: Secondary | ICD-10-CM | POA: Diagnosis not present

## 2021-04-01 DIAGNOSIS — E785 Hyperlipidemia, unspecified: Secondary | ICD-10-CM | POA: Diagnosis not present

## 2021-04-01 DIAGNOSIS — K219 Gastro-esophageal reflux disease without esophagitis: Secondary | ICD-10-CM | POA: Diagnosis not present

## 2021-04-01 DIAGNOSIS — I10 Essential (primary) hypertension: Secondary | ICD-10-CM | POA: Diagnosis not present

## 2021-04-30 DIAGNOSIS — F419 Anxiety disorder, unspecified: Secondary | ICD-10-CM | POA: Diagnosis not present

## 2021-04-30 DIAGNOSIS — K219 Gastro-esophageal reflux disease without esophagitis: Secondary | ICD-10-CM | POA: Diagnosis not present

## 2021-04-30 DIAGNOSIS — E785 Hyperlipidemia, unspecified: Secondary | ICD-10-CM | POA: Diagnosis not present

## 2021-04-30 DIAGNOSIS — I10 Essential (primary) hypertension: Secondary | ICD-10-CM | POA: Diagnosis not present

## 2021-05-28 DIAGNOSIS — N529 Male erectile dysfunction, unspecified: Secondary | ICD-10-CM | POA: Diagnosis not present

## 2021-05-28 DIAGNOSIS — I10 Essential (primary) hypertension: Secondary | ICD-10-CM | POA: Diagnosis not present

## 2021-05-28 DIAGNOSIS — E785 Hyperlipidemia, unspecified: Secondary | ICD-10-CM | POA: Diagnosis not present

## 2021-05-28 DIAGNOSIS — F419 Anxiety disorder, unspecified: Secondary | ICD-10-CM | POA: Diagnosis not present

## 2022-05-09 DIAGNOSIS — R5383 Other fatigue: Secondary | ICD-10-CM | POA: Diagnosis not present

## 2022-05-09 DIAGNOSIS — I10 Essential (primary) hypertension: Secondary | ICD-10-CM | POA: Diagnosis not present

## 2022-05-09 DIAGNOSIS — E7800 Pure hypercholesterolemia, unspecified: Secondary | ICD-10-CM | POA: Diagnosis not present

## 2022-05-09 DIAGNOSIS — N529 Male erectile dysfunction, unspecified: Secondary | ICD-10-CM | POA: Diagnosis not present

## 2022-05-28 DIAGNOSIS — N529 Male erectile dysfunction, unspecified: Secondary | ICD-10-CM | POA: Diagnosis not present

## 2022-05-28 DIAGNOSIS — E7800 Pure hypercholesterolemia, unspecified: Secondary | ICD-10-CM | POA: Diagnosis not present

## 2022-05-28 DIAGNOSIS — R739 Hyperglycemia, unspecified: Secondary | ICD-10-CM | POA: Diagnosis not present

## 2022-05-28 DIAGNOSIS — I10 Essential (primary) hypertension: Secondary | ICD-10-CM | POA: Diagnosis not present

## 2022-06-11 DIAGNOSIS — I10 Essential (primary) hypertension: Secondary | ICD-10-CM | POA: Diagnosis not present

## 2022-06-11 DIAGNOSIS — N529 Male erectile dysfunction, unspecified: Secondary | ICD-10-CM | POA: Diagnosis not present

## 2022-06-11 DIAGNOSIS — E7800 Pure hypercholesterolemia, unspecified: Secondary | ICD-10-CM | POA: Diagnosis not present

## 2022-06-11 DIAGNOSIS — R739 Hyperglycemia, unspecified: Secondary | ICD-10-CM | POA: Diagnosis not present
